# Patient Record
Sex: Female | Born: 1965 | Race: White | Hispanic: No | Marital: Married | Smoking: Never smoker
Health system: Southern US, Community
[De-identification: ages and names within clinical notes are randomized; demographics above are authoritative.]

## PROBLEM LIST (undated history)

## (undated) DIAGNOSIS — J069 Acute upper respiratory infection, unspecified: Secondary | ICD-10-CM

## (undated) HISTORY — DX: Acute upper respiratory infection, unspecified: J06.9

---

## 2014-08-03 ENCOUNTER — Other Ambulatory Visit: Payer: Self-pay

## 2014-08-03 DIAGNOSIS — Z1231 Encounter for screening mammogram for malignant neoplasm of breast: Secondary | ICD-10-CM

## 2014-08-10 ENCOUNTER — Ambulatory Visit
Admission: RE | Admit: 2014-08-10 | Discharge: 2014-08-10 | Disposition: A | Discharge status: Home / Self Care | Payer: PRIVATE HEALTH INSURANCE | Source: Ambulatory Visit

## 2014-08-10 DIAGNOSIS — Z1231 Encounter for screening mammogram for malignant neoplasm of breast: Secondary | ICD-10-CM

## 2015-02-16 HISTORY — PX: VAGINAL HYSTERECTOMY: SUR661

## 2015-06-15 ENCOUNTER — Other Ambulatory Visit: Payer: Self-pay

## 2015-06-15 DIAGNOSIS — Z1231 Encounter for screening mammogram for malignant neoplasm of breast: Secondary | ICD-10-CM

## 2015-08-11 ENCOUNTER — Ambulatory Visit
Admission: RE | Admit: 2015-08-11 | Discharge: 2015-08-11 | Disposition: A | Discharge status: Home / Self Care | Payer: 59 | Source: Ambulatory Visit

## 2015-08-11 DIAGNOSIS — Z1231 Encounter for screening mammogram for malignant neoplasm of breast: Secondary | ICD-10-CM

## 2015-08-24 DIAGNOSIS — H18529 Epithelial (juvenile) corneal dystrophy, unspecified eye: Secondary | ICD-10-CM | POA: Insufficient documentation

## 2015-08-24 DIAGNOSIS — Z8719 Personal history of other diseases of the digestive system: Secondary | ICD-10-CM | POA: Insufficient documentation

## 2015-08-24 DIAGNOSIS — E782 Mixed hyperlipidemia: Secondary | ICD-10-CM | POA: Insufficient documentation

## 2015-08-24 DIAGNOSIS — R5383 Other fatigue: Secondary | ICD-10-CM | POA: Insufficient documentation

## 2015-08-24 DIAGNOSIS — R5381 Other malaise: Secondary | ICD-10-CM | POA: Insufficient documentation

## 2015-09-11 ENCOUNTER — Encounter: Payer: Self-pay | Admitting: Internal Medicine

## 2015-09-11 ENCOUNTER — Ambulatory Visit (INDEPENDENT_AMBULATORY_CARE_PROVIDER_SITE_OTHER): Payer: 59 | Admitting: Internal Medicine

## 2015-09-11 VITALS — BP 114/64 | HR 44 | Temp 98.3°F | Resp 12 | Ht 68.9 in | Wt 164.2 lb

## 2015-09-11 DIAGNOSIS — J310 Chronic rhinitis: Secondary | ICD-10-CM | POA: Insufficient documentation

## 2015-09-11 DIAGNOSIS — R0602 Shortness of breath: Secondary | ICD-10-CM | POA: Diagnosis not present

## 2015-09-11 NOTE — Assessment & Plan Note (Signed)
   Intermittent, currently well controlled  See if symptoms improve with control of nasal drainage.

## 2015-09-11 NOTE — Assessment & Plan Note (Signed)
   May use cetirizine (Zyrtec) 10 mg daily, fluticasone 2 sprays each nostril daily, Singulair 10 mg daily, Alaway one drop each eye twice a day all on an as-needed basis  If having frequent infections, will consider immune screen.

## 2015-09-11 NOTE — Progress Notes (Signed)
Referring provider: Gordan PaymentGreg A. Grisso, MD [address] [place], [place] [id number]  History of Present Illness:  Megan Fowler is a 50 y.o. female seen at the kind request of Dr. Shary DecampGrisso for rhinitis and cough  HPI Comments: Chronic rhinitis: Patient has symptoms that are worse in the fall and spring. She is on antibiotics about 5 times a year for upper respiratory infection symptoms. She does not have any other infection history except for occasional urinary tract infections. She is on cetirizine, fluticasone, Alaway, Singulair and does feel improvement in her symptoms but has breakthrough  Coughing/shortness of breath: Patient has symptoms when rhinitis symptoms are flaring. She does not have a history of pneumonia or any other inhalers. She believes she had a chest x-ray many years ago but cannot recall the results.   Assessment and Plan: Chronic rhinitis  May use cetirizine (Zyrtec) 10 mg daily, fluticasone 2 sprays each nostril daily, Singulair 10 mg daily, Alaway one drop each eye twice a day all on an as-needed basis  If having frequent infections, will consider immune screen.  Shortness of breath  Intermittent, currently well controlled  See if symptoms improve with control of nasal drainage.    Return in about 6 months (around 03/13/2016).    No current outpatient prescriptions on file prior to visit.   No current facility-administered medications on file prior to visit.    Meds ordered this encounter  Medications  . cetirizine (ZYRTEC) 10 MG tablet    Sig: Take 10 mg by mouth.  . fluticasone (FLONASE) 50 MCG/ACT nasal spray    Sig: Place into the nose.  . montelukast (SINGULAIR) 10 MG tablet    Sig: Take 10 mg by mouth.  . Probiotic Product (FORTIFY DAILY PROBIOTIC) CAPS    Sig: Take by mouth.  . Multiple Vitamin (MULTIVITAMINS PO)    Sig: Take by mouth. Nature made  . Black Cohosh 40 MG CAPS    Sig: Take 40 mg by mouth 2 (two) times daily.  . Cranberry 500 MG  TABS    Sig: Take 500 mg by mouth daily.  Marland Kitchen. ketotifen (ALAWAY) 0.025 % ophthalmic solution    Sig: Place 1 drop into both eyes as needed.    Diagnostics: Spirometry: FEV1 2.9L or 88 %, FEV1/FVC  82 %.  This is consistent with mild restriction. Full reversibility without a significant bronchodilator response Aeroallergen skin testing: Negative with a good histamine control Food allergy skin testing: Negative with a good histamine control  Skin tests were interpreted by me, transferred into EPIC by CMA, reviewed and accepted by me into EPIC.  Physical Exam: BP 114/64 mmHg  Pulse 44  Temp(Src) 98.3 F (36.8 C) (Oral)  Resp 12  Ht 5' 8.9" (1.75 m)  Wt 164 lb 3.9 oz (74.5 kg)  BMI 24.33 kg/m2   Physical Exam  Constitutional: She appears well-developed and well-nourished. No distress.  HENT:  Right Ear: External ear normal.  Left Ear: External ear normal.  Nose: Nose normal.  Mouth/Throat: Oropharynx is clear and moist.  Eyes: Conjunctivae are normal. Right eye exhibits no discharge. Left eye exhibits no discharge.  Cardiovascular: Normal rate, regular rhythm and normal heart sounds.   No murmur heard. Pulmonary/Chest: Effort normal and breath sounds normal. No respiratory distress. She has no wheezes. She has no rales.  Abdominal: Soft. Bowel sounds are normal.  Musculoskeletal: She exhibits no edema.  Lymphadenopathy:    She has no cervical adenopathy.  Neurological: She is alert.  Skin:  No rash noted.  Vitals reviewed.   Review of systems: Per HPI unless specifically indicated below Review of Systems  Constitutional: Negative for fever, chills, appetite change and unexpected weight change.  HENT: Positive for congestion, postnasal drip, rhinorrhea and sinus pressure. Negative for ear pain, sneezing and sore throat.   Eyes: Positive for discharge and itching. Negative for pain.  Respiratory: Positive for cough and shortness of breath. Negative for chest tightness and  wheezing.   Cardiovascular: Negative for chest pain and leg swelling.  Gastrointestinal: Negative for vomiting and diarrhea.  Genitourinary: Negative for difficulty urinating.  Musculoskeletal: Negative for joint swelling and arthralgias.  Skin: Negative for rash.  Allergic/Immunologic: Positive for environmental allergies. Negative for food allergies and immunocompromised state.       Stung by wasp bee - no problem No latex allergy  Neurological: Negative for seizures.    Patient Active Problem List   Diagnosis Date Noted  . Chronic rhinitis 09/11/2015  . Shortness of breath 09/11/2015    Past Surgical History  Procedure Laterality Date  . Vaginal hysterectomy  02/16/2015    Family History  Problem Relation Age of Onset  . Diabetes Mother   . Heart disease Mother   . Heart disease Father   . Stroke Father   . Diabetes Maternal Grandmother   . Stroke Maternal Grandmother   . 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  Please do not hesitate to contact me with questions.

## 2015-09-11 NOTE — Patient Instructions (Signed)
Chronic rhinitis  May use cetirizine (Zyrtec) 10 mg daily, fluticasone 2 sprays each nostril daily, Singulair 10 mg daily, Alaway one drop each eye twice a day all on an as-needed basis  If having frequent infections, will consider immune screen.  Shortness of breath  Intermittent, currently well controlled  See if symptoms improve with control of nasal drainage.

## 2015-09-11 NOTE — Addendum Note (Signed)
Addended by: Isaias CowmanUGLAS, Mikayla Chiusano M on: 09/11/2015 02:31 PM   Modules accepted: Kipp BroodSmartSet

## 2016-03-14 ENCOUNTER — Ambulatory Visit: Payer: 59 | Admitting: Allergy and Immunology

## 2019-12-06 ENCOUNTER — Other Ambulatory Visit: Payer: Self-pay | Admitting: Specialist

## 2019-12-06 DIAGNOSIS — Z1231 Encounter for screening mammogram for malignant neoplasm of breast: Secondary | ICD-10-CM

## 2020-01-10 ENCOUNTER — Other Ambulatory Visit: Payer: Self-pay

## 2020-01-10 ENCOUNTER — Ambulatory Visit
Admission: RE | Admit: 2020-01-10 | Discharge: 2020-01-10 | Disposition: A | Discharge status: Home / Self Care | Payer: 59 | Source: Ambulatory Visit | Attending: Specialist | Admitting: Specialist

## 2020-01-10 DIAGNOSIS — Z1231 Encounter for screening mammogram for malignant neoplasm of breast: Secondary | ICD-10-CM

## 2020-03-06 DIAGNOSIS — R7303 Prediabetes: Secondary | ICD-10-CM | POA: Insufficient documentation

## 2020-12-11 ENCOUNTER — Other Ambulatory Visit: Payer: Self-pay | Admitting: Specialist

## 2020-12-11 DIAGNOSIS — Z1231 Encounter for screening mammogram for malignant neoplasm of breast: Secondary | ICD-10-CM

## 2021-02-05 ENCOUNTER — Ambulatory Visit: Payer: 59

## 2021-02-15 ENCOUNTER — Ambulatory Visit
Admission: RE | Admit: 2021-02-15 | Discharge: 2021-02-15 | Disposition: A | Discharge status: Home / Self Care | Payer: No Typology Code available for payment source | Source: Ambulatory Visit | Attending: Specialist | Admitting: Specialist

## 2021-02-15 ENCOUNTER — Other Ambulatory Visit: Payer: Self-pay

## 2021-02-15 DIAGNOSIS — Z1231 Encounter for screening mammogram for malignant neoplasm of breast: Secondary | ICD-10-CM

## 2021-05-25 ENCOUNTER — Other Ambulatory Visit: Payer: Self-pay | Admitting: *Deleted

## 2021-05-25 ENCOUNTER — Encounter: Payer: Self-pay | Admitting: Sports Medicine

## 2021-05-25 ENCOUNTER — Other Ambulatory Visit: Payer: Self-pay

## 2021-05-25 ENCOUNTER — Ambulatory Visit (INDEPENDENT_AMBULATORY_CARE_PROVIDER_SITE_OTHER): Payer: 59

## 2021-05-25 ENCOUNTER — Ambulatory Visit: Payer: No Typology Code available for payment source | Admitting: Sports Medicine

## 2021-05-25 DIAGNOSIS — M779 Enthesopathy, unspecified: Secondary | ICD-10-CM

## 2021-05-25 DIAGNOSIS — M79671 Pain in right foot: Secondary | ICD-10-CM | POA: Diagnosis not present

## 2021-05-25 DIAGNOSIS — M778 Other enthesopathies, not elsewhere classified: Secondary | ICD-10-CM

## 2021-05-25 DIAGNOSIS — M7750 Other enthesopathy of unspecified foot: Secondary | ICD-10-CM

## 2021-05-25 DIAGNOSIS — M7751 Other enthesopathy of right foot: Secondary | ICD-10-CM | POA: Diagnosis not present

## 2021-05-25 DIAGNOSIS — K219 Gastro-esophageal reflux disease without esophagitis: Secondary | ICD-10-CM | POA: Insufficient documentation

## 2021-05-25 DIAGNOSIS — Z1211 Encounter for screening for malignant neoplasm of colon: Secondary | ICD-10-CM | POA: Insufficient documentation

## 2021-05-25 MED ORDER — MELOXICAM 7.5 MG PO TABS: 7.5000 mg | ORAL_TABLET | Freq: Every day | ORAL | 0 refills | Status: DC

## 2021-05-25 MED ORDER — PREDNISONE 10 MG (21) PO TBPK: ORAL_TABLET | ORAL | 0 refills | Status: AC

## 2021-05-25 NOTE — Patient Instructions (Signed)
Shoe List ?For tennis shoes recommend:  ?Brooks Beast ?Asics ?New balance ?Saucony ?HOKA ?Can be purchased at [employer] or [employer] ?Sketchers arch fit ?Can be purchased at any major retailers ? ?Vionic  ?SAS ?Can be purchased at [employer] or [employer]  ? ?For work shoes recommend: ?Sketchers Work ?Timberland boots  ?Redwing boots ?Can be purchased at a variety of places or [employer]  ? ?For casual shoes recommend: ?Oofos ?Can be purchased at [employer] or [employer] ?Vionic  ?Can be purchased at [employer] or [employer]  ? ?Orthotics  ?For Over-the-Counter Orthotics recommend: ?Power Steps ?Can be purchased in office/[facility] ?Superfeet ?Can be purchased at [employer] or [employer] ?Airplus ?Can be purchased at [employer] ?For Custom Orthotics recommend: ?Custom fitting/Appointment at [facility] ? ?

## 2021-05-26 ENCOUNTER — Encounter: Payer: Self-pay | Admitting: Sports Medicine

## 2021-05-26 NOTE — Progress Notes (Signed)
Subjective: Megan Fowler is a 56 y.o. female patient who presents to office for evaluation of right foot pain. Patient complains of progressive pain especially over the last 3 months off and on that started at top but now remains on the bottom side. History of wart removal on this foot in 2009 and wears compression sock as of late fall Reports soreness, tenderness, burning, and throbbing to area. Denies injury or trauma.   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     fluticasone (FLONASE) 50 MCG/ACT nasal spray Place into the nose.     Multiple Vitamin (MULTIVITAMINS PO) Take by mouth. Nature made     No current facility-administered medications on file prior to visit.    No Known Allergies  Objective:  General: Alert and oriented x3 in no acute distress  Dermatology: No open lesions bilateral lower extremities, no webspace macerations, no ecchymosis bilateral, all nails x 10 are well manicured.  Vascular: Dorsalis Pedis and Posterior Tibial pedal pulses palpable, Capillary Fill Time 3 seconds,(+) pedal hair growth bilateral, no edema bilateral lower extremities, Temperature gradient within normal limits.  Neurology: Johney Maine sensation intact via light touch bilateral.  Musculoskeletal: Mild tenderness with palpation at  peroneal tendon course at 5th met base and plantar lateral arch on the right foot  There is decreased ankle rom with knee extending  vs flexed resembling gastroc equnius bilateral, Subtalar  joint range of motion is within normal limits, there is no 1st ray hypermobility noted bilateral. Strength within normal limits in all groups bilateral.    Xrays  Right Foot   Impression:No acute osseous findings  Assessment and Plan: Problem List Items Addressed This Visit   None Visit Diagnoses     Tendonitis    -  Primary   Relevant Orders   DG Foot Complete Right   Right foot pain            -Complete examination performed -Xrays reviewed -Discussed treatement options for ? Tendonitis, peroneal -Rx Trilock brace to use as directed -Rx Prednisone to take 1st as directed followed by Mobic as tolerated -Recommend good supportive shoes; Shoe list provided -Recommend rest, ice, elevation, and decrease activities that could aggravate the area -Patient to return to office in 1 month or sooner if condition worsens.  Landis Martins, DPM

## 2021-05-29 ENCOUNTER — Encounter: Payer: Self-pay | Admitting: Sports Medicine

## 2021-05-30 ENCOUNTER — Other Ambulatory Visit: Payer: Self-pay | Admitting: Sports Medicine

## 2021-05-30 DIAGNOSIS — M7750 Other enthesopathy of unspecified foot: Secondary | ICD-10-CM

## 2021-05-31 ENCOUNTER — Encounter: Payer: Self-pay | Admitting: Sports Medicine

## 2021-06-01 ENCOUNTER — Ambulatory Visit: Payer: 59 | Admitting: Sports Medicine

## 2021-06-08 ENCOUNTER — Other Ambulatory Visit: Payer: Self-pay | Admitting: Sports Medicine

## 2021-06-08 MED ORDER — MELOXICAM 7.5 MG PO TABS: 7.5000 mg | ORAL_TABLET | Freq: Two times a day (BID) | ORAL | 6 refills | Status: AC

## 2021-06-08 NOTE — Progress Notes (Signed)
Refilled mobic  ?

## 2021-06-22 ENCOUNTER — Ambulatory Visit: Payer: No Typology Code available for payment source | Admitting: Sports Medicine

## 2021-06-22 ENCOUNTER — Encounter: Payer: Self-pay | Admitting: Sports Medicine

## 2021-06-22 DIAGNOSIS — M79671 Pain in right foot: Secondary | ICD-10-CM | POA: Diagnosis not present

## 2021-06-22 DIAGNOSIS — M779 Enthesopathy, unspecified: Secondary | ICD-10-CM

## 2021-06-22 NOTE — Progress Notes (Signed)
Subjective: Megan Fowler is a 56 y.o. female patient who returns office for follow-up evaluation of right foot pain.  Patient reports that there is no throbbing and pain is much better, only minimal twinges and states that she is got new work shoes however if the brace sometimes she cannot wear with these new shoes.  Patient denies any other pedal complaints at this time.  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     cetirizine (ZYRTEC) 10 MG tablet Take 10 mg by mouth.     cyclobenzaprine (FLEXERIL) 10 MG tablet      fluticasone (FLONASE) 50 MCG/ACT nasal spray Place into the nose.     fluticasone (FLONASE) 50 MCG/ACT nasal spray 1 spray by Each Nare route daily as needed.     ibuprofen (IBU-200) 200 MG tablet      Lactobacillus Rhamnosus, GG, (RA PROBIOTIC DIGESTIVE CARE) CAPS Take 1 capsule by mouth daily.     meloxicam (MOBIC) 7.5 MG tablet Take 1 tablet (7.5 mg total) by mouth in the morning and at bedtime. 30 tablet 6   Multiple Vitamin (MULTIVITAMINS PO) Take by mouth. Nature made     Multiple Vitamins-Minerals (WOMENS 50+ MULTI VITAMIN/MIN PO)      predniSONE (STERAPRED UNI-PAK 21 TAB) 10 MG (21) TBPK tablet Take as directed 21 tablet 0   No current facility-administered medications on file prior to visit.    No Known Allergies  Objective:   General: Alert and oriented x3 in no acute distress  Dermatology: Unremarkable  Neurovascular status intact.  Musculoskeletal: Reduced tenderness with palpation at  peroneal tendon course at 5th met base and plantar lateral arch on the right foot unchanged limited ankle joint range of motion on the right.  Assessment and Plan: Problem List Items Addressed This Visit   None Visit Diagnoses     Tendonitis    -  Primary   Right foot pain            -Complete examination performed -Discussed with patient continue care for tendinitis which is improving -Advised patient to continue good supportive shoes daily for foot type -Advised patient to continue with meloxicam until finished -Advised patient rest ice and to limit any excessive activities at this time advised patient after 1 month of no pain may consider increasing activities to tolerance -Patient to return to office as needed or if symptoms fail to continue to improve  Landis Martins, DPM

## 2021-12-18 ENCOUNTER — Other Ambulatory Visit: Payer: Self-pay | Admitting: Specialist

## 2021-12-18 DIAGNOSIS — Z1231 Encounter for screening mammogram for malignant neoplasm of breast: Secondary | ICD-10-CM

## 2022-02-18 ENCOUNTER — Ambulatory Visit
Admission: RE | Admit: 2022-02-18 | Discharge: 2022-02-18 | Disposition: A | Discharge status: Home / Self Care | Payer: No Typology Code available for payment source | Source: Ambulatory Visit | Attending: Specialist | Admitting: Specialist

## 2022-02-18 DIAGNOSIS — Z1231 Encounter for screening mammogram for malignant neoplasm of breast: Secondary | ICD-10-CM

## 2022-03-29 IMAGING — MG MM DIGITAL SCREENING BILAT W/ TOMO AND CAD
8 series · 8 of 24 positions shown · non-contrast
Comparison: Previous exam(s).

CLINICAL DATA: Screening.

EXAM:
DIGITAL SCREENING BILATERAL MAMMOGRAM WITH TOMOSYNTHESIS AND CAD
TECHNIQUE: Bilateral screening digital craniocaudal and mediolateral oblique
mammograms were obtained. Bilateral screening digital breast
tomosynthesis was performed. The images were evaluated with
computer-aided detection.

[L MLO synth-2D]
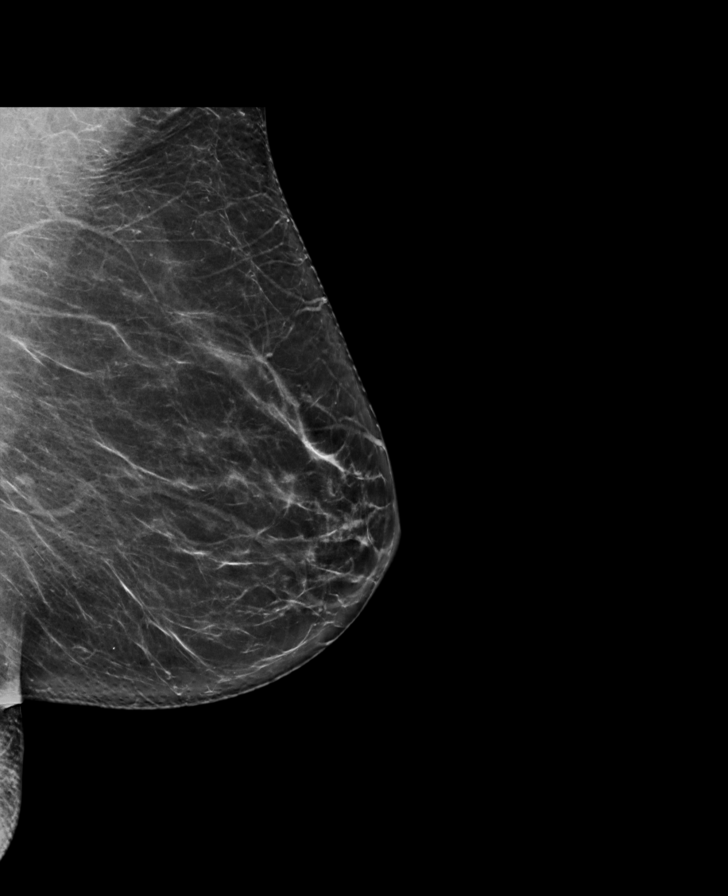

[L CC synth-2D]
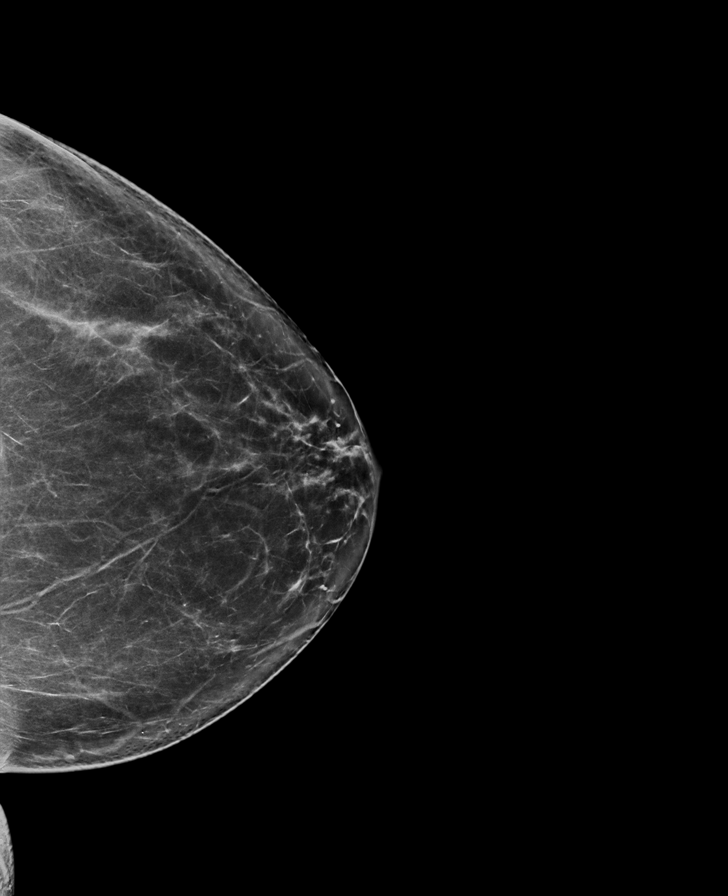

[R MLO synth-2D]
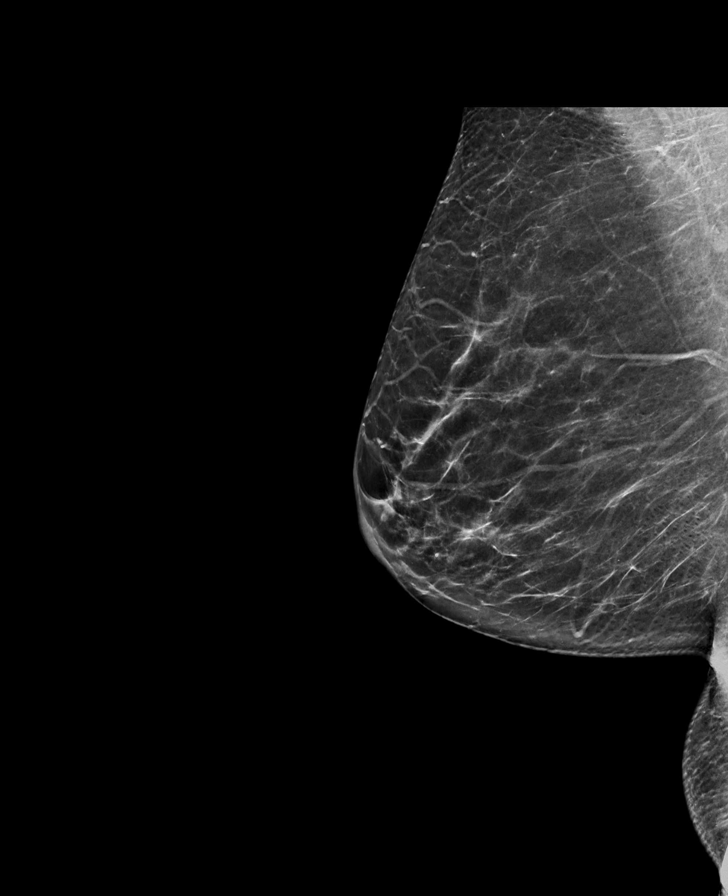

[R CC synth-2D]
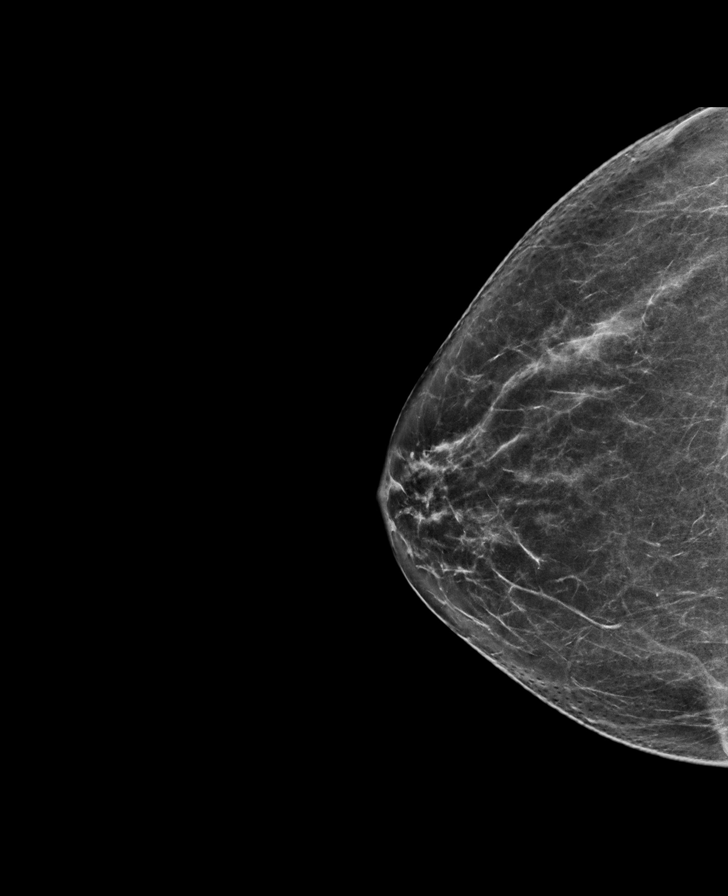

[L CC tomo · tomo slice 39/78.0]
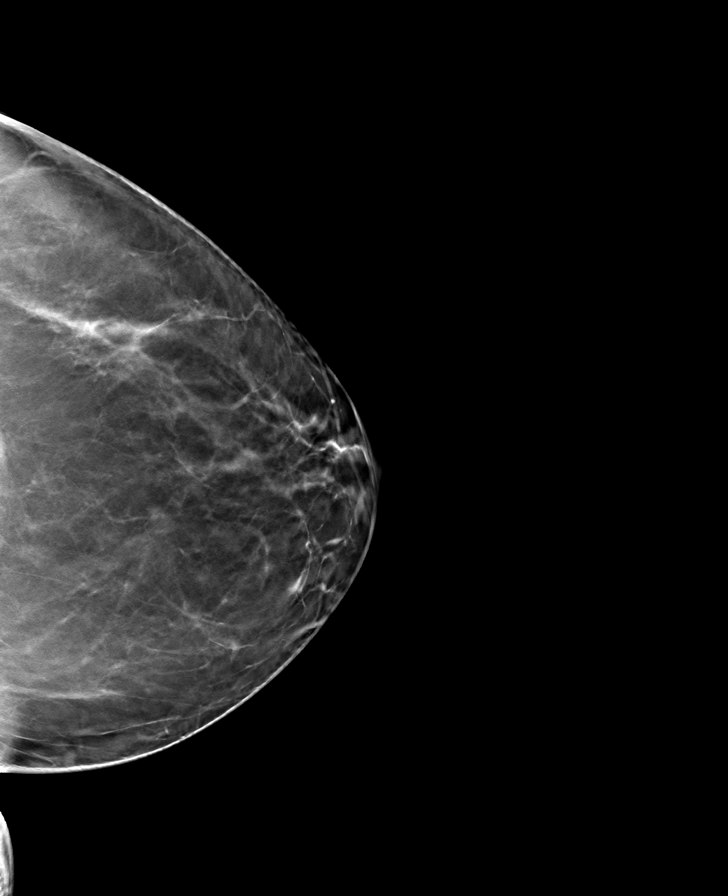

[R MLO tomo · tomo slice 37/73.0]
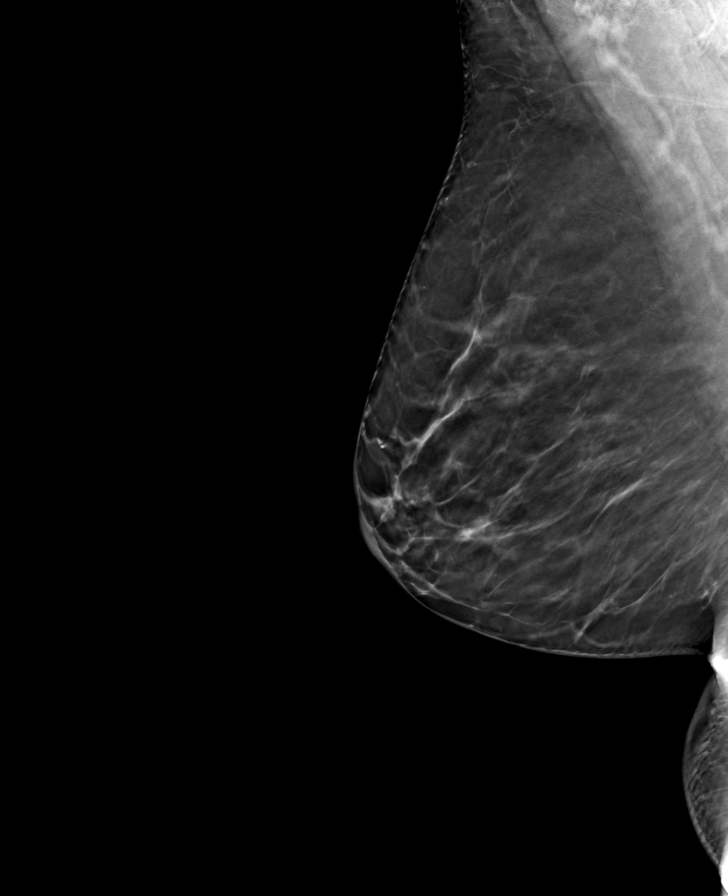

[L MLO tomo · tomo slice 39/78.0]
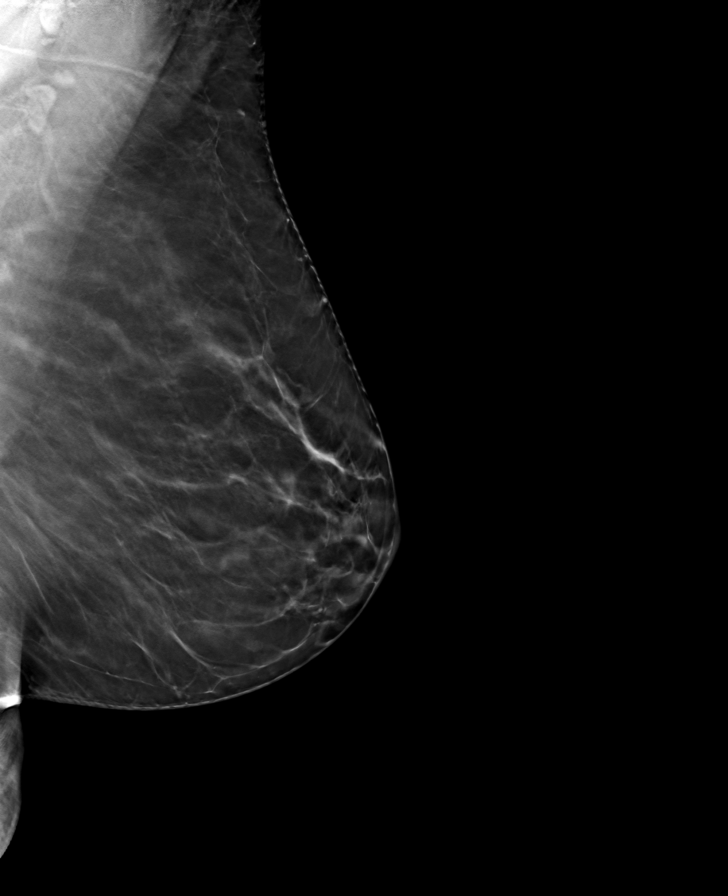

[R CC tomo · tomo slice 36/71.0]
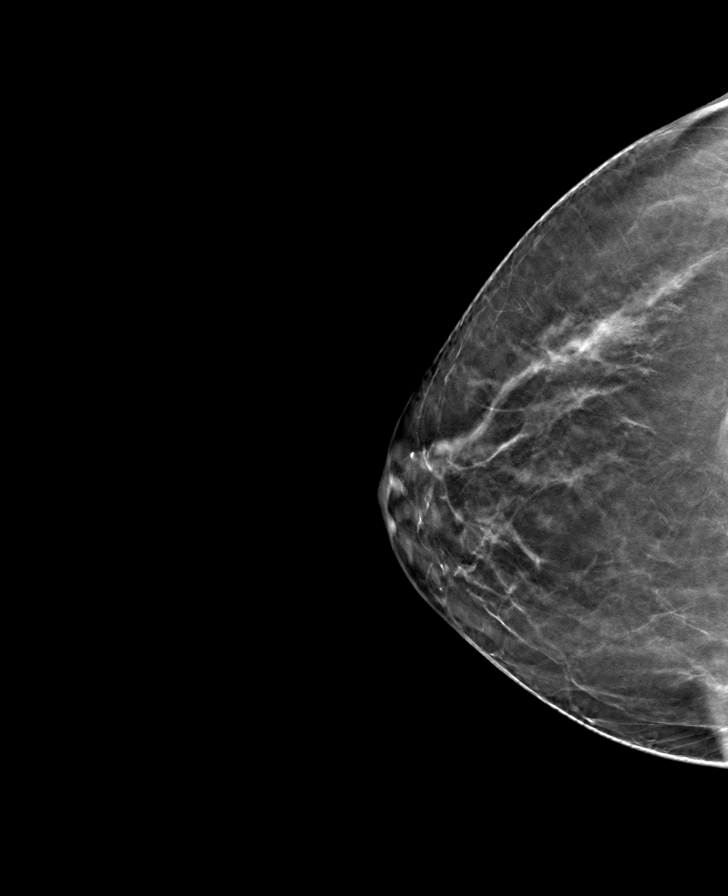

[8 of 24 positions shown; findings below may reference images not displayed]

ACR Breast Density Category b: There are scattered areas of
fibroglandular density.
FINDINGS: There are no findings suspicious for malignancy.
IMPRESSION: No mammographic evidence of malignancy. A result letter of this
screening mammogram will be mailed directly to the patient.

RECOMMENDATION:
Screening mammogram in one year. (Code:51-O-LD2)

BI-RADS CATEGORY  1: Negative.

## 2023-01-03 ENCOUNTER — Other Ambulatory Visit: Payer: Self-pay | Admitting: Specialist

## 2023-01-03 DIAGNOSIS — Z1231 Encounter for screening mammogram for malignant neoplasm of breast: Secondary | ICD-10-CM

## 2023-02-24 ENCOUNTER — Ambulatory Visit
Admission: RE | Admit: 2023-02-24 | Discharge: 2023-02-24 | Disposition: A | Discharge status: Home / Self Care | Payer: Commercial Managed Care - PPO | Source: Ambulatory Visit | Attending: Specialist | Admitting: Specialist

## 2023-02-24 DIAGNOSIS — Z1231 Encounter for screening mammogram for malignant neoplasm of breast: Secondary | ICD-10-CM

## 2023-12-22 ENCOUNTER — Other Ambulatory Visit: Payer: Self-pay | Admitting: Specialist

## 2023-12-22 DIAGNOSIS — Z1231 Encounter for screening mammogram for malignant neoplasm of breast: Secondary | ICD-10-CM

## 2024-02-25 ENCOUNTER — Ambulatory Visit
Admission: RE | Admit: 2024-02-25 | Discharge: 2024-02-25 | Disposition: A | Discharge status: Home / Self Care | Source: Ambulatory Visit | Attending: Specialist | Admitting: Specialist

## 2024-02-25 DIAGNOSIS — Z1231 Encounter for screening mammogram for malignant neoplasm of breast: Secondary | ICD-10-CM
